# Patient Record
Sex: Female | Born: 1945 | Race: White | Hispanic: No | Marital: Married | Smoking: Former smoker
Health system: Southern US, Community
[De-identification: ages and names within clinical notes are randomized; demographics above are authoritative.]

## PROBLEM LIST (undated history)

## (undated) DIAGNOSIS — J45909 Unspecified asthma, uncomplicated: Secondary | ICD-10-CM

## (undated) DIAGNOSIS — E785 Hyperlipidemia, unspecified: Secondary | ICD-10-CM

## (undated) DIAGNOSIS — G4733 Obstructive sleep apnea (adult) (pediatric): Secondary | ICD-10-CM

## (undated) HISTORY — PX: BREAST LUMPECTOMY: SHX2

## (undated) HISTORY — DX: Hyperlipidemia, unspecified: E78.5

## (undated) HISTORY — DX: Unspecified asthma, uncomplicated: J45.909

## (undated) HISTORY — DX: Obstructive sleep apnea (adult) (pediatric): G47.33

## (undated) HISTORY — PX: RHINOPLASTY: SUR1284

---

## 2013-08-10 ENCOUNTER — Ambulatory Visit (INDEPENDENT_AMBULATORY_CARE_PROVIDER_SITE_OTHER): Payer: Medicare Other | Admitting: Internal Medicine

## 2013-08-10 ENCOUNTER — Ambulatory Visit (INDEPENDENT_AMBULATORY_CARE_PROVIDER_SITE_OTHER)
Admission: RE | Admit: 2013-08-10 | Discharge: 2013-08-10 | Disposition: A | Discharge status: Home / Self Care | Payer: Medicare Other | Source: Ambulatory Visit | Attending: Internal Medicine | Admitting: Internal Medicine

## 2013-08-10 ENCOUNTER — Encounter: Payer: Self-pay | Admitting: Internal Medicine

## 2013-08-10 VITALS — BP 112/74 | HR 66 | Temp 97.0°F | Ht 67.0 in | Wt 169.0 lb

## 2013-08-10 DIAGNOSIS — R9389 Abnormal findings on diagnostic imaging of other specified body structures: Secondary | ICD-10-CM

## 2013-08-10 DIAGNOSIS — J449 Chronic obstructive pulmonary disease, unspecified: Secondary | ICD-10-CM

## 2013-08-10 DIAGNOSIS — J45909 Unspecified asthma, uncomplicated: Secondary | ICD-10-CM | POA: Insufficient documentation

## 2013-08-10 DIAGNOSIS — R918 Other nonspecific abnormal finding of lung field: Secondary | ICD-10-CM

## 2013-08-10 MED ORDER — AMOXICILLIN-POT CLAVULANATE 875-125 MG PO TABS: 1.0000 | ORAL_TABLET | Freq: Two times a day (BID) | ORAL | Status: DC

## 2013-08-10 MED ORDER — MOMETASONE FURO-FORMOTEROL FUM 200-5 MCG/ACT IN AERO: INHALATION_SPRAY | RESPIRATORY_TRACT | Status: DC

## 2013-08-10 NOTE — Progress Notes (Signed)
Subjective:    Patient ID: Julia Christensen, female    DOB: 08/07/1946   MRN: [id number]  HPI  15 yowf former smoker with lots of bronchitis as child missed school but between spells did fine with exercise smoked  until 1987  With onset AB then got worse and freq pred rx then saw immunologist in [place] weaned off prednisone after several  Years but maint rx since then and self - referred 08/10/2013  to pulmonary with freq AB esp in summer times since moved in [place]  About 2004   08/10/2013 1st [facility] office visit/ Julia Christensen  Chief Complaint  Patient presents with  . Pulmonary Consult    Self referral- Pt states dxed with asthma in 1983. She c/o increased cough since July 2014. Cough is "constant" and is made worse by hot/humid weather. Cough is prod with large amounts of brown sputum. She also c/o increased DOE for the past 3 months- sometimes gets out of breath just walking a few steps.    Cough is worse after stir and 5pm. Has been seen at [facility] with dx ? Bronchiectasis 2 y prior to OV    No obvious patterns day to day or daytime variabilty or assoc chronic cough or cp or chest tightness, subjective wheeze overt sinus or hb symptoms. No unusual exp hx or h/o childhood pna/ asthma or knowledge of premature birth.  Sleeping ok without nocturnal  or early am exacerbation  of respiratory  c/o's or need for noct saba. Also denies any obvious fluctuation of symptoms with weather or environmental changes or other aggravating or alleviating factors except as outlined above   Current Medications, Allergies, Complete Past Medical History, Past Surgical History, Family History, and Social History were reviewed in [employer] record.  ROS  The following are not active complaints unless bolded sore throat, dysphagia, dental problems, itching, sneezing,  nasal congestion or excess/ purulent secretions, ear ache,   fever, chills, sweats, unintended wt loss, pleuritic or  exertional cp, hemoptysis,  orthopnea pnd or leg swelling, presyncope, palpitations, heartburn, abdominal pain, anorexia, nausea, vomiting, diarrhea  or change in bowel or urinary habits, change in stools or urine, dysuria,hematuria,  rash, arthralgias, visual complaints, headache, numbness weakness or ataxia or problems with walking or coordination,  change in mood/affect or memory.       Review of Systems  Constitutional: Negative for fever, chills and unexpected weight change.  HENT: Positive for congestion. Negative for ear pain, nosebleeds, sore throat, rhinorrhea, sneezing, trouble swallowing, dental problem, voice change, postnasal drip and sinus pressure.   Eyes: Negative for visual disturbance.  Respiratory: Positive for cough and shortness of breath. Negative for choking.   Cardiovascular: Negative for chest pain and leg swelling.  Gastrointestinal: Negative for vomiting, abdominal pain and diarrhea.  Genitourinary: Negative for difficulty urinating.       Acid Heartburn  Musculoskeletal: Positive for arthralgias.  Skin: Negative for rash.  Neurological: Positive for headaches. Negative for tremors and syncope.  Hematological: Does not bruise/bleed easily.       Objective:   Physical Exam   amb wf nad  Wt Readings from Last 3 Encounters:  08/10/13 169 lb (76.658 kg)     HEENT mild turbinate edema.  Oropharynx no thrush or excess pnd or cobblestoning.  No JVD or cervical adenopathy. Mild accessory muscle hypertrophy. Trachea midline, nl thryroid. Chest was hyperinflated by percussion with diminished breath sounds and moderate increased exp time without wheeze. Hoover sign positive at mid  inspiration. Regular rate and rhythm without murmur gallop or rub or increase P2 or edema.  Abd: no hsm, nl excursion. Ext warm without cyanosis or clubbing.         CXR  08/10/2013 :  Unusual right perihilar opacity which appears to reside within the right upper lobe. The possibility of a  perihilar mass is not excluded, and further evaluation with contrast enhanced chest CT is recommended at this time.      Assessment & Plan:

## 2013-08-10 NOTE — Assessment & Plan Note (Addendum)
Has been seen at [facility] with dx ? Bronchiectasis but last cxr report from [facility] (no ct on record) does not suggest a R Hilar process   Will repeat in 6 weeks and consider ct next in meantime rx with augmentin empically for ? Bronchiectasis flare

## 2013-08-10 NOTE — Assessment & Plan Note (Signed)
DDX of  difficult airways managment all start with A and  include Adherence, Ace Inhibitors, Acid Reflux, Active Sinus Disease, Alpha 1 Antitripsin deficiency, Anxiety masquerading as Airways dz,  ABPA,  allergy(esp in young), Aspiration (esp in elderly), Adverse effects of DPI,  Active smokers, plus two Bs  = Bronchiectasis and Beta blocker use..and one C= CHF  Adherence is always the initial "prime suspect" and is a multilayered concern that requires a "trust but verify" approach in every patient - starting with knowing how to use medications, especially inhalers, correctly, keeping up with refills and understanding the fundamental difference between maintenance and prns vs those medications only taken for a very short course and then stopped and not refilled. The proper method of use, as well as anticipated side effects, of a metered-dose inhaler are discussed and demonstrated to the patient. Improved effectiveness after extensive coaching during this visit to a level of approximately  75% so try dulera 200 2bid  ? Adverse effect of dpi > d/c advair  ? Bronchiectasis or active sinusitis > rx augmentin

## 2013-08-10 NOTE — Patient Instructions (Addendum)
Dulera 200 Take 2 puffs first thing in am and then another 2 puffs about 12 hours later.     Only use your albuterol (proventil)  as a rescue medication to be used if you can't catch your breath by resting or doing a relaxed purse lip breathing pattern.  - The less you use it, the better it will work when you need it. - Ok to use up to every 4 hours if you must but call for immediate appointment if use goes up over your usual need - Don't leave home without it !!  (think of it like your spare tire for your car)  - if this fails, then you would need the nebulizer up to every 4hours and call for appointment   Augmentin 875 twice daily x 10 days   Please remember to go to the  x-ray department downstairs for your tests - we will call you with the results when they are available.    Please schedule a follow up office visit in 4 weeks, sooner if needed  Late add cxr on return f/u R hilar fullness

## 2013-08-26 ENCOUNTER — Other Ambulatory Visit: Payer: Self-pay | Admitting: Internal Medicine

## 2013-08-26 MED ORDER — MOMETASONE FURO-FORMOTEROL FUM 200-5 MCG/ACT IN AERO: INHALATION_SPRAY | RESPIRATORY_TRACT | Status: DC

## 2013-09-01 ENCOUNTER — Telehealth: Payer: Self-pay | Admitting: Internal Medicine

## 2013-09-01 NOTE — Telephone Encounter (Signed)
I spoke with [facility]. Was advised they wanted to make sure pt was no longer going to be on advair since they received RX for dulera. I advised this is correct. Nothing further needed

## 2013-09-10 ENCOUNTER — Ambulatory Visit (INDEPENDENT_AMBULATORY_CARE_PROVIDER_SITE_OTHER)
Admission: RE | Admit: 2013-09-10 | Discharge: 2013-09-10 | Disposition: A | Discharge status: Home / Self Care | Payer: Medicare Other | Source: Ambulatory Visit | Attending: Internal Medicine | Admitting: Internal Medicine

## 2013-09-10 ENCOUNTER — Encounter: Payer: Self-pay | Admitting: Internal Medicine

## 2013-09-10 ENCOUNTER — Ambulatory Visit (INDEPENDENT_AMBULATORY_CARE_PROVIDER_SITE_OTHER): Payer: Medicare Other | Admitting: Internal Medicine

## 2013-09-10 ENCOUNTER — Other Ambulatory Visit (INDEPENDENT_AMBULATORY_CARE_PROVIDER_SITE_OTHER): Payer: Medicare Other

## 2013-09-10 VITALS — BP 122/80 | HR 67 | Temp 97.7°F | Ht 67.5 in | Wt 168.0 lb

## 2013-09-10 DIAGNOSIS — J449 Chronic obstructive pulmonary disease, unspecified: Secondary | ICD-10-CM

## 2013-09-10 DIAGNOSIS — J45909 Unspecified asthma, uncomplicated: Secondary | ICD-10-CM

## 2013-09-10 DIAGNOSIS — R918 Other nonspecific abnormal finding of lung field: Secondary | ICD-10-CM

## 2013-09-10 DIAGNOSIS — R9389 Abnormal findings on diagnostic imaging of other specified body structures: Secondary | ICD-10-CM

## 2013-09-10 LAB — CBC WITH DIFFERENTIAL/PLATELET
Basophils Absolute: 0 10*3/uL (ref 0.0–0.1)
Eosinophils Absolute: 0.9 10*3/uL — ABNORMAL HIGH (ref 0.0–0.7)
HCT: 44.5 % (ref 36.0–46.0)
Hemoglobin: 15.1 g/dL — ABNORMAL HIGH (ref 12.0–15.0)
Lymphs Abs: 2.6 10*3/uL (ref 0.7–4.0)
MCHC: 34 g/dL (ref 30.0–36.0)
Neutro Abs: 4.3 10*3/uL (ref 1.4–7.7)
RDW: 12.3 % (ref 11.5–14.6)

## 2013-09-10 LAB — PULMONARY FUNCTION TEST

## 2013-09-10 LAB — BASIC METABOLIC PANEL
CO2: 28 mEq/L (ref 19–32)
Calcium: 9.8 mg/dL (ref 8.4–10.5)
GFR: 72.49 mL/min (ref >60.00)
Glucose, Bld: 100 mg/dL — ABNORMAL HIGH (ref 70–99)
Sodium: 137 mEq/L (ref 135–145)

## 2013-09-10 MED ORDER — IOHEXOL 350 MG/ML SOLN
80.0000 mL | Freq: Once | INTRAVENOUS | Status: AC | PRN
  Administered 2013-09-10: 80 mL via INTRAVENOUS

## 2013-09-10 MED ORDER — FAMOTIDINE 20 MG PO TABS: ORAL_TABLET | ORAL | Status: DC

## 2013-09-10 MED ORDER — PANTOPRAZOLE SODIUM 40 MG PO TBEC: 40.0000 mg | DELAYED_RELEASE_TABLET | Freq: Every day | ORAL | Status: DC

## 2013-09-10 MED ORDER — PREDNISONE (PAK) 10 MG PO TABS: ORAL_TABLET | ORAL | Status: DC

## 2013-09-10 NOTE — Assessment & Plan Note (Addendum)
-   hfa 75% 08/10/2013  - pfts nl x fef 25-75 09/10/2013   Symptoms are markedly disproportionate to objective findings and not clear this is a lung problem but pt does appear to have difficult airway management issues. DDX of  difficult airways managment all start with A and  include Adherence, Ace Inhibitors, Acid Reflux, Active Sinus Disease, Alpha 1 Antitripsin deficiency, Anxiety masquerading as Airways dz,  ABPA,  allergy(esp in young), Aspiration (esp in elderly), Adverse effects of DPI,  Active smokers, plus two Bs  = Bronchiectasis and Beta blocker use..and one C= CHF  ? Acid (or non-acid) GERD > always difficult to exclude as up to 75% of pts in some series report no assoc GI/ Heartburn symptoms> rec max (24h)  acid suppression and diet restrictions/ reviewed and instructions given in writting  ? Allergy >  Eos 11%  ? Active sinus dz > ct sinus  ? Bronchiectasis > ct chest   For now try off dulera to see what difference if any this makes  See instructions for specific recommendations which were reviewed directly with the patient who was given a copy with highlighter outlining the key components.

## 2013-09-10 NOTE — Progress Notes (Signed)
PFT done today. 

## 2013-09-10 NOTE — Assessment & Plan Note (Signed)
cxr's reviewed with pt, no prior studies from [facility] in access anywhere > CT chest next step

## 2013-09-10 NOTE — Patient Instructions (Addendum)
Pantoprazole (protonix) 40 mg   Take 30-60 min before first meal of the day and Pepcid 20 mg one bedtime until return to office - this is the best way to tell whether stomach acid is contributing to your problem.    dulera 200 1-2 every 12 hours if you feel it helps  Prednisone 10 mg take  4 each am x 2 days,   2 each am x 2 days,  1 each am x 2 days and stop   Please see patient coordinator before you leave today  to schedule sinus and chest ct  Please remember to go to the lab department downstairs for your tests - we will call you with the results when they are available.  GERD (REFLUX)  is an extremely common cause of respiratory symptoms, many times with no significant heartburn at all.    It can be treated with medication, but also with lifestyle changes including avoidance of late meals, excessive alcohol, smoking cessation, and avoid fatty foods, chocolate, peppermint, colas, red wine, and acidic juices such as orange juice.  NO MINT OR MENTHOL PRODUCTS SO NO COUGH DROPS  USE SUGARLESS CANDY INSTEAD (jolley ranchers or Stover's)  NO OIL BASED VITAMINS - use powdered substitutes.    Please schedule a follow up office visit in 4 weeks, sooner if needed

## 2013-09-10 NOTE — Progress Notes (Signed)
Subjective:    Patient ID: Julia Christensen, female    DOB: Oct 02, 1946   MRN: [id number]    Brief patient profile:  47 yowf former smoker with lots of bronchitis as child missed school but between spells did fine with exercise smoked  until 1987  With onset AB then got worse and freq pred rx then saw immunologist in [place] weaned off prednisone after several  Years but maint rx since then and self - referred 08/10/2013  to pulmonary with freq AB esp in summer times since moved in [place]  About 2004    History of Present Illness  08/10/2013 1st [facility] office visit/ Wert  Chief Complaint  Patient presents with  . Pulmonary Consult    Self referral- Pt states dxed with asthma in 1983. She c/o increased cough since July 2014. Cough is "constant" and is made worse by hot/humid weather. Cough is prod with large amounts of brown sputum. She also c/o increased DOE for the past 3 months- sometimes gets out of breath just walking a few steps.  Cough is worse after stir and 5pm. Has been seen at [facility] with dx ? Bronchiectasis 2 y prior to OV rec Dulera 200 Take 2 puffs first thing in am and then another 2 puffs about 12 hours later.  Only use your albuterol (proventil)  as a rescue medication   Augmentin 875 twice daily x 10 days         09/10/2013 f/u ov/Wert re: asthma/ ? bronchiectasis Chief Complaint  Patient presents with  . Follow-up    PFT and CXR done today. Pt reports that her SOB and cough are unchanged since the last visit. She c/o increased hoarsness for the past wk.   walking flat ok, sob with hills but not very active  Cough was prod brown, now lighter  Not convinced better with dulera - off x 2 days and no change, prednisone works the best Not using saba much at all       No obvious patterns day to day or daytime variabilty or assoc  cp or chest tightness, subjective wheeze overt sinus or hb symptoms. No unusual exp hx or h/o childhood pna/ asthma or knowledge of  premature birth.  Sleeping ok without nocturnal  or early am exacerbation  of respiratory  c/o's or need for noct saba. Also denies any obvious fluctuation of symptoms with weather or environmental changes or other aggravating or alleviating factors except as outlined above   Current Medications, Allergies, Complete Past Medical History, Past Surgical History, Family History, and Social History were reviewed in [employer] record.  ROS  The following are not active complaints unless bolded sore throat, dysphagia, dental problems, itching, sneezing,  nasal congestion or excess/ purulent secretions, ear ache,   fever, chills, sweats, unintended wt loss, pleuritic or exertional cp, hemoptysis,  orthopnea pnd or leg swelling, presyncope, palpitations, heartburn, abdominal pain, anorexia, nausea, vomiting, diarrhea  or change in bowel or urinary habits, change in stools or urine, dysuria,hematuria,  rash, arthralgias, visual complaints, headache, numbness weakness or ataxia or problems with walking or coordination,  change in mood/affect or memory.           Objective:   Physical Exam   amb wf nad  Wt Readings from Last 3 Encounters:  09/10/13 168 lb (76.204 kg)  08/10/13 169 lb (76.658 kg)        HEENT: nl dentition, turbinates, and orophanx. Nl external ear canals without cough reflex  NECK :  without JVD/Nodes/TM/ nl carotid upstrokes bilaterally   LUNGS: insp and exp rhonchi with upper airway features   CV:  RRR  no s3 or murmur or increase in P2, no edema   ABD:  soft and nontender with nl excursion in the supine position. No bruits or organomegaly, bowel sounds nl  MS:  warm without deformities, calf tenderness, cyanosis or clubbing  SKIN: warm and dry without lesions    NEURO:  alert, approp, no deficits          CXR  09/10/2013 :  1. Persistent right perihilar density      Assessment & Plan:

## 2013-09-11 ENCOUNTER — Encounter: Payer: Self-pay | Admitting: Internal Medicine

## 2013-09-11 LAB — ALLERGY PROFILE REGION II-DC, DE, MD, ~~LOC~~, VA
Alternaria Alternata: <0.1 kU/L
Box Elder IgE: <0.1 kU/L
Cladosporium Herbarum: <0.1 kU/L
Cockroach: <0.1 kU/L
Dog Dander: <0.1 kU/L
IgE (Immunoglobulin E), Serum: 27.3 IU/mL (ref 0.0–180.0)
Johnson Grass: <0.1 kU/L
Lamb's Quarters: <0.1 kU/L
Pecan/Hickory Tree IgE: <0.1 kU/L

## 2013-09-11 NOTE — Progress Notes (Signed)
Quick Note:  Spoke with pt and notified of results per Dr. Wert. Pt verbalized understanding and denied any questions.  ______ 

## 2013-09-11 NOTE — Progress Notes (Signed)
Quick Note:  Advised pt's husband Gerlene Burdock) of labs per MW. He verbalized understanding and had no further questions ______

## 2013-09-22 ENCOUNTER — Telehealth: Payer: Self-pay | Admitting: Internal Medicine

## 2013-09-22 DIAGNOSIS — J45909 Unspecified asthma, uncomplicated: Secondary | ICD-10-CM

## 2013-09-22 MED ORDER — FLUTICASONE-SALMETEROL 250-50 MCG/DOSE IN AEPB: 1.0000 | INHALATION_SPRAY | Freq: Two times a day (BID) | RESPIRATORY_TRACT | Status: AC

## 2013-09-22 MED ORDER — CLOTRIMAZOLE 10 MG MT TROC: 10.0000 mg | Freq: Four times a day (QID) | OROMUCOSAL | Status: DC

## 2013-09-22 NOTE — Telephone Encounter (Signed)
Pt aware if recs and rx sent. Nothing further needed

## 2013-09-22 NOTE — Telephone Encounter (Signed)
Fine with me - give year's supply

## 2013-09-22 NOTE — Telephone Encounter (Signed)
I spoke with pt. She reports the dulera made her cough constantly. She stopped this medication Friday. She restarted advair 250-50 1 puff BID. Since doing the the cough has stopped and the CP has subsided. She will need an RX for the advair. Please advise MW thanks

## 2013-09-22 NOTE — Telephone Encounter (Signed)
Clotrimazole troch 10 mg qid prn #12

## 2013-09-22 NOTE — Telephone Encounter (Signed)
Pt is aware of recs. She forgot to mention she has thrush from [place]. She reports her mouth is coated in white stuff. She is requesting MMW. Please advise MW thanks  Allergies  Allergen Reactions  . Asa [Aspirin]     "bronchial tubes close"

## 2013-09-23 ENCOUNTER — Encounter: Payer: Self-pay | Admitting: Internal Medicine

## 2013-10-08 ENCOUNTER — Encounter: Payer: Self-pay | Admitting: Internal Medicine

## 2013-10-08 ENCOUNTER — Ambulatory Visit (INDEPENDENT_AMBULATORY_CARE_PROVIDER_SITE_OTHER): Payer: Medicare Other | Admitting: Internal Medicine

## 2013-10-08 VITALS — BP 132/74 | HR 64 | Temp 97.7°F | Ht 67.0 in | Wt 170.0 lb

## 2013-10-08 DIAGNOSIS — J45909 Unspecified asthma, uncomplicated: Secondary | ICD-10-CM

## 2013-10-08 DIAGNOSIS — J310 Chronic rhinitis: Secondary | ICD-10-CM

## 2013-10-08 DIAGNOSIS — R918 Other nonspecific abnormal finding of lung field: Secondary | ICD-10-CM

## 2013-10-08 DIAGNOSIS — R9389 Abnormal findings on diagnostic imaging of other specified body structures: Secondary | ICD-10-CM

## 2013-10-08 MED ORDER — FAMOTIDINE 20 MG PO TABS: ORAL_TABLET | ORAL | Status: DC

## 2013-10-08 MED ORDER — AMOXICILLIN-POT CLAVULANATE 875-125 MG PO TABS: 1.0000 | ORAL_TABLET | Freq: Two times a day (BID) | ORAL | Status: DC

## 2013-10-08 MED ORDER — PREDNISONE (PAK) 10 MG PO TABS: ORAL_TABLET | ORAL | Status: DC

## 2013-10-08 NOTE — Assessment & Plan Note (Signed)
I emphasized that nasal steroids have no immediate benefit in terms of improving symptoms.  To help them reached the target tissue, the patient should use Afrin two puffs every 12 hours applied one min before using the nasal steroids.  Afrin should be stopped after no more than 5 days.  If the symptoms worsen, Afrin can be restarted after 5 days off of therapy to prevent rebound congestion from overuse of Afrin.  I also emphasized that in no way are nasal steroids a concern in terms of "addiction".   If not better rx with augmentin and 6 d prednisone     Each maintenance medication was reviewed in detail including most importantly the difference between maintenance and as needed and under what circumstances the prns are to be used.  Please see instructions for details which were reviewed in writing and the patient given a copy.

## 2013-10-08 NOTE — Progress Notes (Signed)
Subjective:    Patient ID: Julia Christensen, female    DOB: 05/25/1946   MRN: [id number]    Brief patient profile:  50 yowf former smoker with lots of bronchitis as child missed school but between spells did fine with exercise smoked  until 1987  With onset AB then got worse and freq pred rx then saw immunologist in [place] weaned off prednisone after several  Years but maint rx since then and self - referred 08/10/2013  to pulmonary with freq AB esp in summer times since moved in [place]  About 2004    History of Present Illness  08/10/2013 1st [facility] office visit/ Dawaun Brancato  Chief Complaint  Patient presents with  . Pulmonary Consult    Self referral- Pt states dxed with asthma in 1983. She c/o increased cough since July 2014. Cough is "constant" and is made worse by hot/humid weather. Cough is prod with large amounts of brown sputum. She also c/o increased DOE for the past 3 months- sometimes gets out of breath just walking a few steps.  Cough is worse after stir and 5pm. Has been seen at [facility] with dx ? Bronchiectasis 2 y prior to OV rec Dulera 200 Take 2 puffs first thing in am and then another 2 puffs about 12 hours later.  Only use your albuterol (proventil)  as a rescue medication   Augmentin 875 twice daily x 10 days         09/10/2013 f/u ov/Almendra Loria re: asthma/ ? bronchiectasis Chief Complaint  Patient presents with  . Follow-up    PFT and CXR done today. Pt reports that her SOB and cough are unchanged since the last visit. She c/o increased hoarsness for the past wk.   walking flat ok, sob with hills but not very active  Cough was prod brown, now lighter  Not convinced better with dulera - off x 2 days and no change, prednisone works the best Not using saba much at all  Pantoprazole (protonix) 40 mg   Take 30-60 min before first meal of the day and Pepcid 20 mg one bedtime until return to office  .   dulera 200 1-2 every 12 hours > changed to Advair 09/22/13 pt  preference Prednisone 10 mg take  4 each am x 2 days,   2 each am x 2 days,  1 each am x 2 days and stop      10/08/2013 f/u ov/Raylyn Carton re: chronic asthma/ no need for rescue ot neb saba  Chief Complaint  Patient presents with  . Follow-up    Pt reports cough is doing some better since last visit. She c/o nasal and sinus congestion for the past wk.   not usually bothered by nasal stuffiness but does use flonase qam/ not using ppi or hs pepcid at all. Mucus turning slt yellow. Worse early am's     No obvious patterns day to day or daytime variabilty or assoc sob cp or chest tightness, subjective wheeze overt sinus or hb symptoms. No unusual exp hx or h/o childhood pna/ asthma or knowledge of premature birth.  Sleeping ok without nocturnal  or early am exacerbation  of respiratory  c/o's or need for noct saba. Also denies any obvious fluctuation of symptoms with weather or environmental changes or other aggravating or alleviating factors except as outlined above   Current Medications, Allergies, Complete Past Medical History, Past Surgical History, Family History, and Social History were reviewed in [employer] record.  ROS  The following are not active complaints unless bolded sore throat, dysphagia, dental problems, itching, sneezing,  nasal congestion or excess/ purulent secretions, ear ache,   fever, chills, sweats, unintended wt loss, pleuritic or exertional cp, hemoptysis,  orthopnea pnd or leg swelling, presyncope, palpitations, heartburn, abdominal pain, anorexia, nausea, vomiting, diarrhea  or change in bowel or urinary habits, change in stools or urine, dysuria,hematuria,  rash, arthralgias, visual complaints, headache, numbness weakness or ataxia or problems with walking or coordination,  change in mood/affect or memory.           Objective:   Physical Exam   amb wf nad  Wt Readings from Last 3 Encounters:  10/08/13 170 lb (77.111 kg)  09/10/13 168 lb  (76.204 kg)  08/10/13 169 lb (76.658 kg)            HEENT: nl dentition,  and orophanx. Nl external ear canals without cough reflex Bilateral non specific swelling / crusting both turn   NECK :  without JVD/Nodes/TM/ nl carotid upstrokes bilaterally   LUNGS: clear bilaterally    CV:  RRR  no s3 or murmur or increase in P2, no edema   ABD:  soft and nontender with nl excursion in the supine position. No bruits or organomegaly, bowel sounds nl  MS:  warm without deformities, calf tenderness, cyanosis or clubbing  SKIN: warm and dry without lesions               CXR  09/10/2013 : -  Persistent right perihilar density      Assessment & Plan:

## 2013-10-08 NOTE — Patient Instructions (Addendum)
Add back the pepcid at bedtime but ok leave pantaprazole off for now as long as you continue to observe the diet    Nasal steroids (flonase) have no immediate benefit in terms of improving symptoms.  To help them reached the target tissue,  yoy should use Afrin two puffs every 12 hours applied one min before using the nasal steroids.  Afrin should be stopped after no more than 5 days.  If the symptoms worsen, Afrin can be restarted after 5 days off of therapy to prevent rebound congestion from overuse of Afrin.  I also emphasized that in no way are nasal steroids a concern in terms of "addiction".   If not improving  Augmentin 875 mg take one pill twice daily  X 10 days - take at breakfast and supper with large glass of water.  It would help reduce the usual side effects (diarrhea and yeast infections) if you ate cultured yogurt at lunch.  Prednisone 10 mg take  4 each am x 2 days,   2 each am x 2 days,  1 each am x 2 days and stop   Please schedule a follow up visit in 3 months but call sooner if needed

## 2013-10-08 NOTE — Assessment & Plan Note (Signed)
1. No evidence of acute pulmonary embolism. 2. Diffuse central airway thickening with mucous impaction and lower lobes and peribronchial airspace disease in the right lower lobe. This is suspicious for peribronchial inflammation. No dominant mass identified. 3. Prominent lymphoid tissue in the hilar regions, likely reactive.  C/w chronic asthma > f/u cxr in 3 months

## 2013-10-08 NOTE — Assessment & Plan Note (Signed)
-   hfa 75% 08/10/2013  - pfts nl x fef 25-75 09/10/2013  - Allergy profile 09/10/2013 >  Eos  11 >  IgE 27.3 neg allergens - Sinus CT 09/10/2013 > chronic changes only  - requested advair to replace dulera 09/22/2013 due to cough  All goals of chronic asthma control met including optimal function and elimination of symptoms with minimal need for rescue therapy.  Contingencies discussed in full including contacting this office immediately if not controlling the symptoms using the rule of two's.

## 2014-03-04 ENCOUNTER — Ambulatory Visit: Payer: Self-pay | Admitting: Podiatry

## 2014-03-11 ENCOUNTER — Ambulatory Visit (INDEPENDENT_AMBULATORY_CARE_PROVIDER_SITE_OTHER): Payer: Medicare Other | Admitting: Podiatry

## 2014-03-11 ENCOUNTER — Ambulatory Visit (INDEPENDENT_AMBULATORY_CARE_PROVIDER_SITE_OTHER): Payer: Medicare Other

## 2014-03-11 ENCOUNTER — Encounter: Payer: Self-pay | Admitting: Podiatry

## 2014-03-11 DIAGNOSIS — M779 Enthesopathy, unspecified: Secondary | ICD-10-CM

## 2014-03-11 DIAGNOSIS — R52 Pain, unspecified: Secondary | ICD-10-CM

## 2014-03-11 DIAGNOSIS — M216X9 Other acquired deformities of unspecified foot: Secondary | ICD-10-CM

## 2014-03-11 DIAGNOSIS — L84 Corns and callosities: Secondary | ICD-10-CM

## 2014-03-11 NOTE — Progress Notes (Signed)
   Subjective:    Patient ID: Julia Christensen, female    DOB: 1946-11-01, 68 y.o.   MRN: [id number]  HPI Comments: "I have these calluses"  Patient c/o hard, thick, calluses plantar forefoot and 3rd toes bilateral and heel left for several years. She has trimmed these down with a razor before. They are very sensitive when walking.     Review of Systems  Musculoskeletal: Positive for gait problem.  All other systems reviewed and are negative.      Objective:   Physical Exam        Assessment & Plan:

## 2014-03-12 NOTE — Progress Notes (Signed)
Subjective:     Patient ID: Julia Christensen, female   DOB: 06/19/1946, 68 y.o.   MRN: [id number]  HPI patient presents stating I have also calluses on the bottom of both my feet that make walking difficult and make any form of exercise almost impossible. Been going on for several years   Review of Systems  All other systems reviewed and are negative.      Objective:   Physical Exam  Nursing note and vitals reviewed. Constitutional: She is oriented to person, place, and time.  Cardiovascular: Intact distal pulses.   Musculoskeletal: Normal range of motion.  Neurological: She is oriented to person, place, and time.  Skin: Skin is dry.   neurovascular status is intact with range of motion within normal limits subtalar midtarsal joint and muscle strength adequate. 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 Deep debridement of lesions was accomplished today and orthotics scanned to try to take pressure off the feet at this time

## 2014-03-26 ENCOUNTER — Ambulatory Visit: Payer: Medicare Other | Admitting: *Deleted

## 2014-03-26 DIAGNOSIS — L84 Corns and callosities: Secondary | ICD-10-CM

## 2014-03-26 NOTE — Patient Instructions (Signed)

## 2014-03-26 NOTE — Progress Notes (Signed)
   Subjective:    Patient ID: Julia Christensen, female    DOB: 1946-05-01, 68 y.o.   MRN: [id number]  HPI PICK UP ORTHOTICS AND GIVEN INSTRUCTION.   Review of Systems     Objective:   Physical Exam        Assessment & Plan:

## 2014-04-20 IMAGING — CT CT PARANASAL SINUSES LIMITED
1 of 2 series · 15 of 19 positions shown, 19 images · non-contrast
Comparison: None.

CLINICAL DATA: History of chronic sinus drainage. Retro-orbital
headaches.

EXAM:
CT PARANASAL SINUS LIMITED WITHOUT CONTRAST
TECHNIQUE: Non-contiguous multidetector CT images of the paranasal sinuses were
obtained in a single plane without contrast. BB marker was placed on
the right.

[Series 4: ltd sinus 3.0 h30s · axial · 0.32mm/px · z∈[-90,+5]mm · 15 of 18 slices shown, 19 images]
[im 2/18  brain]
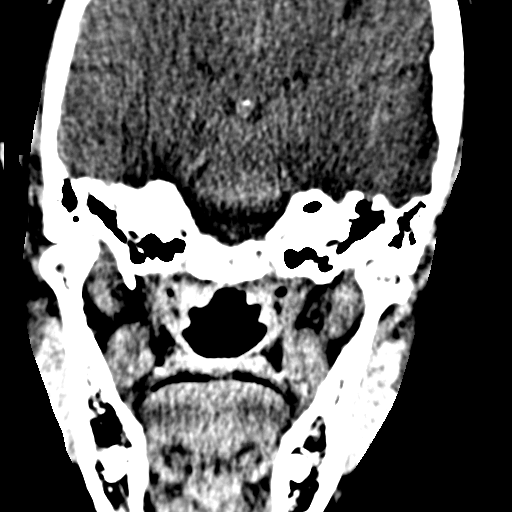
[im 2/18  bone]
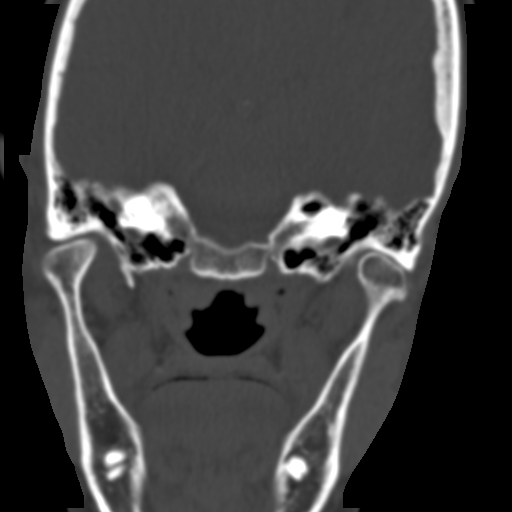
[im 3/18  bone]
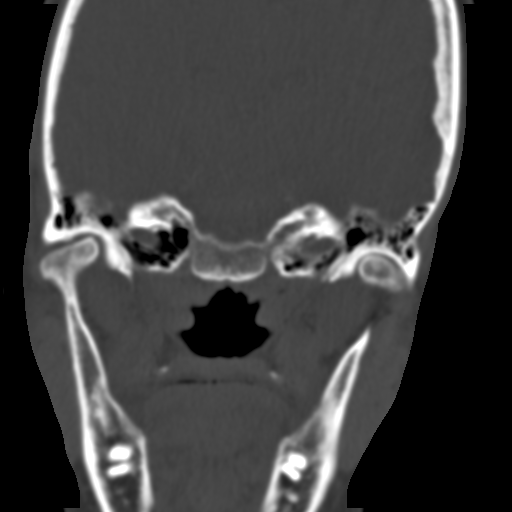
[im 4/18  bone]
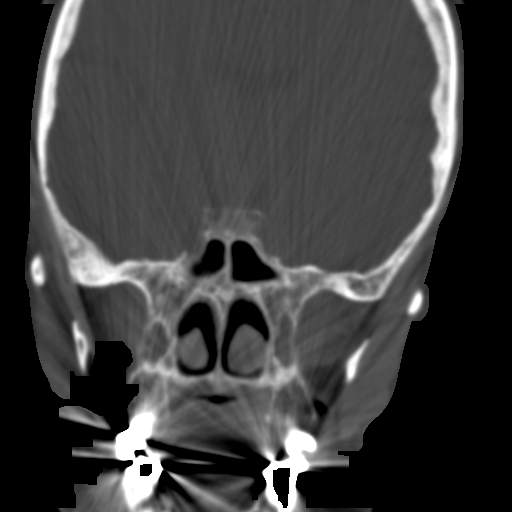
[im 5/18  bone]
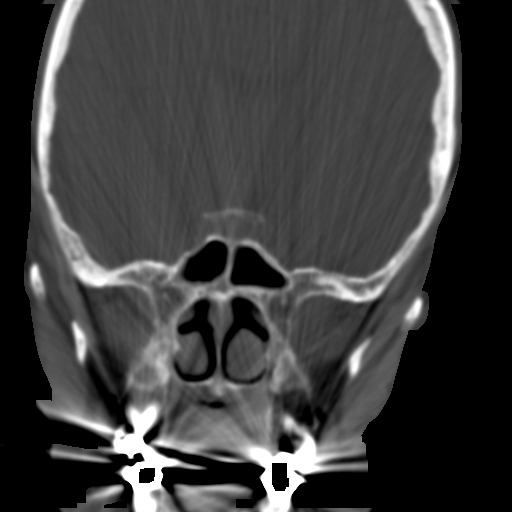
[im 6/18  brain]
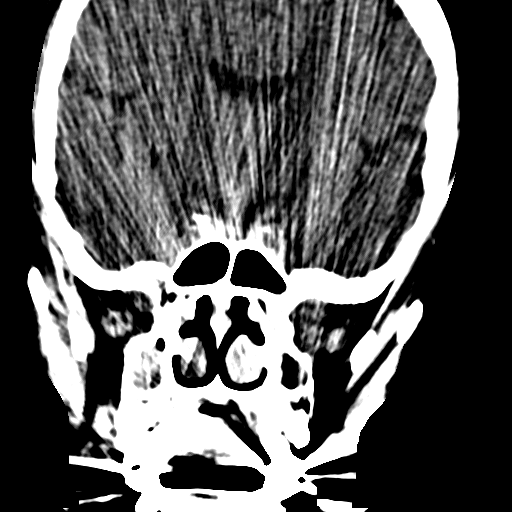
[im 6/18  bone]
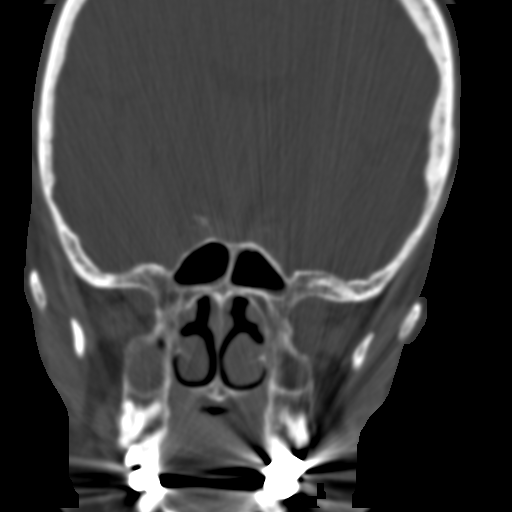
[im 7/18  bone]
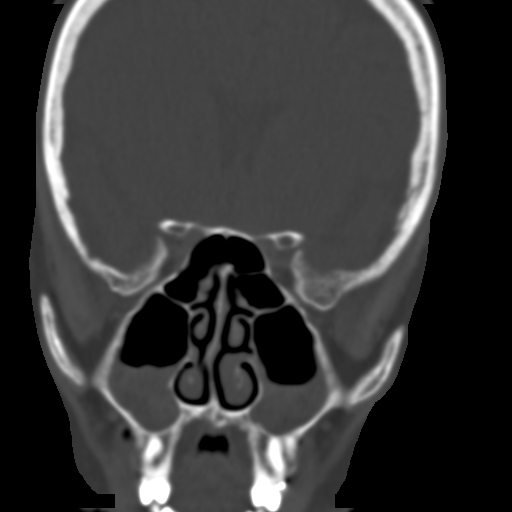
[im 8/18  bone]
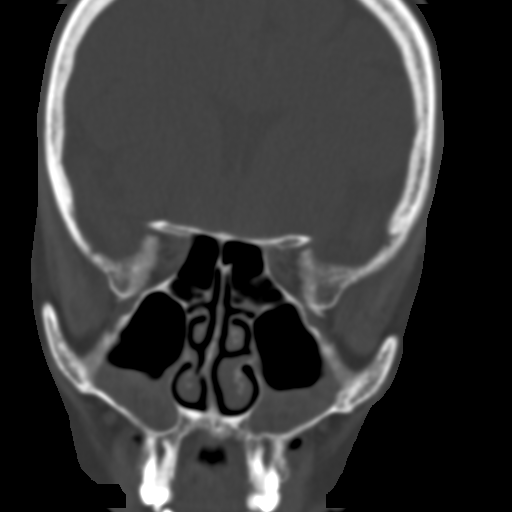
[im 10/18  bone]
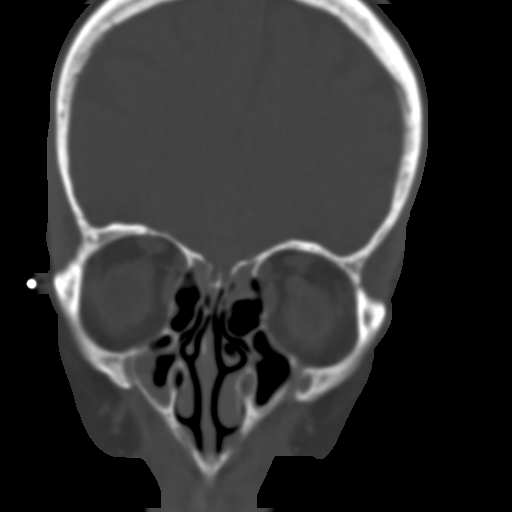
[im 11/18  brain]
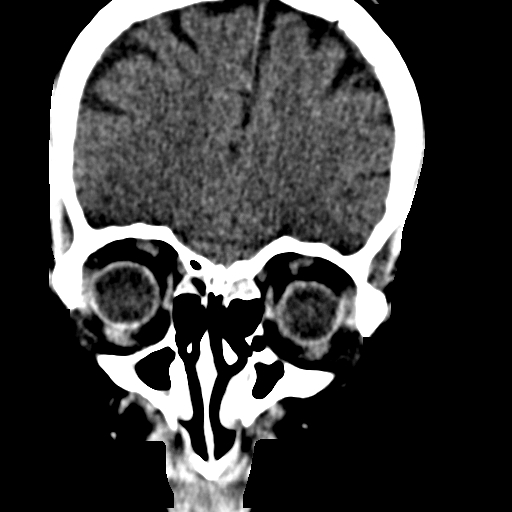
[im 11/18  bone]
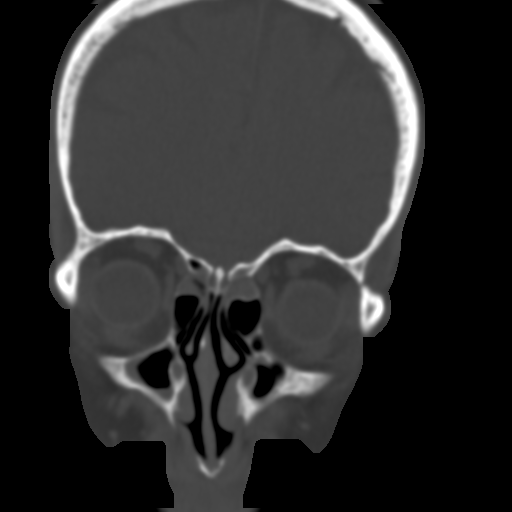
[im 12/18  bone]
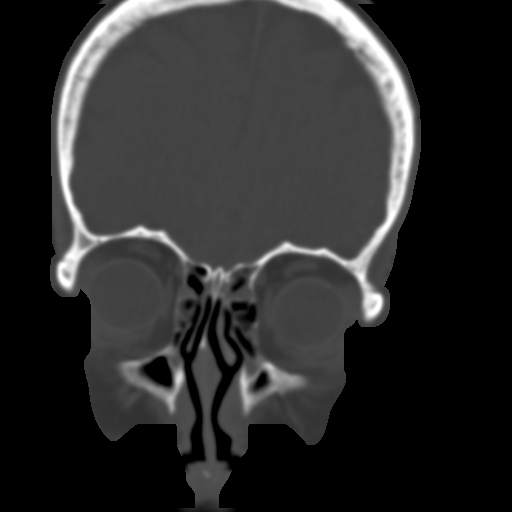
[im 13/18  bone]
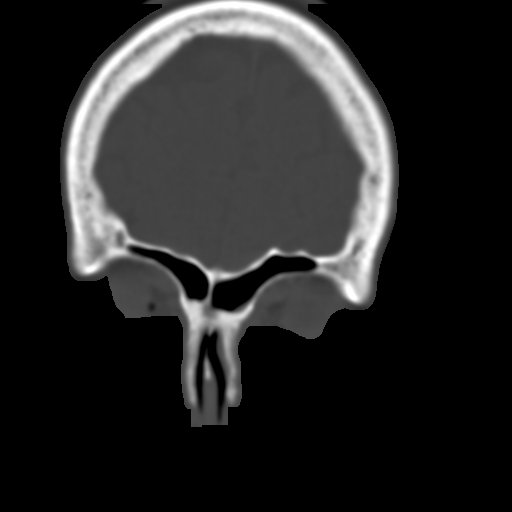
[im 14/18  bone]
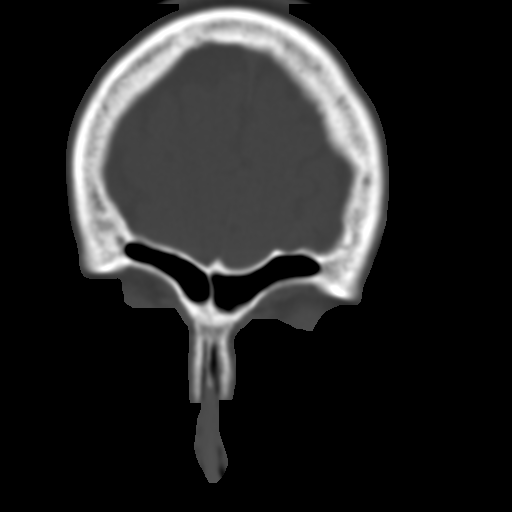
[im 15/18  brain]
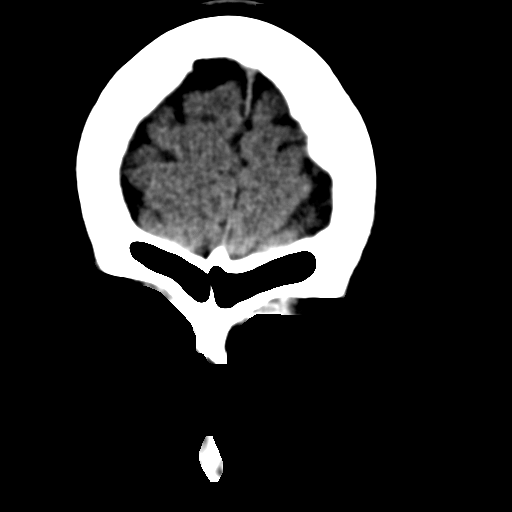
[im 15/18  bone]
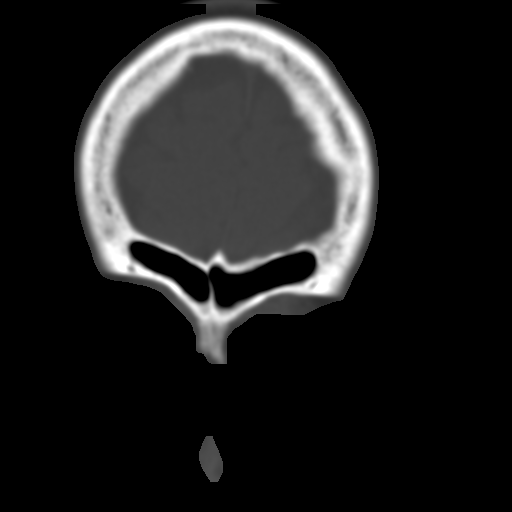
[im 16/18  bone]
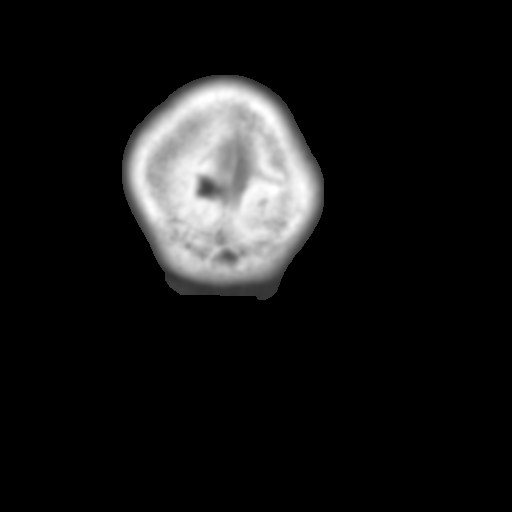
[im 17/18  bone]
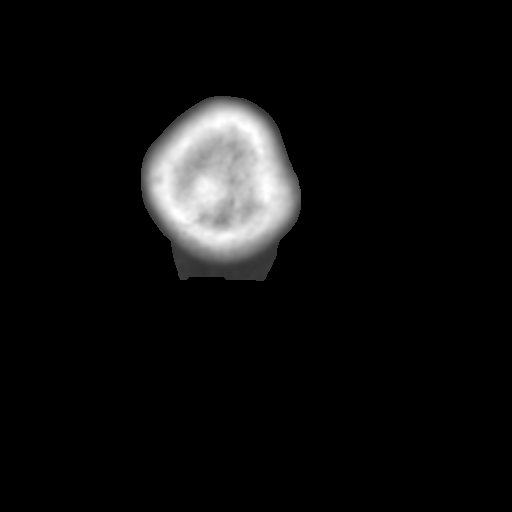

[15 of 19 positions shown; findings below may reference images not displayed]

FINDINGS: There is slight nasal septal deviation to the right. There is
minimal spurring on the right. No nasal septal perforation or
destruction is evident. There is concha bullosa of the middle
terminate on the left.

There is considerable mucosal thickening involving both maxillary
sinuses. There is mucosal thickening in the area of the maxillary at
ostium bilaterally. There is mucosal thickening involving bilateral
ethmoid air cells. There is very minimal mucosal thickening
involving the frontal sinuses. There is no significant sphenoid
sinus mucosal thickening is seen. No air-fluid level, sinus
expansion, or bony destruction is evident.

The portion of the mastoids seen bilaterally show well-aerated
mastoid air cells. Portion of the skull included on examination
appears normal. Portion of the brain included on examination shows
some cortical atrophy.
IMPRESSION: There is considerable mucosal thickening involving both maxillary
sinuses. There is mucosal thickening in the area of the maxillary at
ostium bilaterally. There is mucosal thickening involving bilateral
ethmoid air cells. There is very minimal mucosal thickening
involving the frontal sinuses. There is no significant sphenoid
sinus mucosal thickening is seen. No air-fluid level, sinus
expansion, or bony destruction is evident.

Slight nasal septal deviation to the right.

## 2014-04-22 ENCOUNTER — Encounter: Payer: Self-pay | Admitting: Podiatry

## 2014-04-22 ENCOUNTER — Ambulatory Visit (INDEPENDENT_AMBULATORY_CARE_PROVIDER_SITE_OTHER): Payer: Medicare Other | Admitting: Podiatry

## 2014-04-22 VITALS — BP 123/74 | HR 64 | Resp 16

## 2014-04-22 DIAGNOSIS — L84 Corns and callosities: Secondary | ICD-10-CM

## 2014-04-22 NOTE — Progress Notes (Signed)
Subjective:     Patient ID: Julia Christensen, female   DOB: 1946-04-11, 68 y.o.   MRN: [id number]  HPI patient states the orthotics are really helping me that the lesions have returned right over left foot and are painful   Review of Systems     Objective:   Physical Exam Plantar keratotic lesion right over left foot    Assessment:     Chronic lesion formation secondary to structure and skin type    Plan:     Debris painful callus on both feet with no bleeding noted reappoint as needed

## 2014-07-01 ENCOUNTER — Encounter: Payer: Self-pay | Admitting: Podiatry

## 2014-07-01 ENCOUNTER — Ambulatory Visit: Payer: Medicare Other | Admitting: Podiatry

## 2014-07-01 VITALS — BP 152/84 | HR 69 | Resp 15 | Ht 67.0 in | Wt 170.0 lb

## 2014-07-01 DIAGNOSIS — L84 Corns and callosities: Secondary | ICD-10-CM | POA: Diagnosis not present

## 2014-07-01 NOTE — Progress Notes (Signed)
Subjective:     Patient ID: Julia Christensen, female   DOB: [birth year], 68 y.o.   MRN: [id number]  HPI patient presents with numerous lesions on the plantar of both feet and the third toes   Review of Systems     Objective:   Physical Exam Neurovascular status intact with approximate 7 keratotic lesions that are painful when pressed    Assessment:     Chronic lesion formation both feet    Plan:     Debridement lesions on both feet with no iatrogenic bleeding noted

## 2014-10-21 ENCOUNTER — Ambulatory Visit (INDEPENDENT_AMBULATORY_CARE_PROVIDER_SITE_OTHER): Payer: Medicare Other | Admitting: Podiatry

## 2014-10-21 ENCOUNTER — Encounter: Payer: Self-pay | Admitting: Podiatry

## 2014-10-21 DIAGNOSIS — M2041 Other hammer toe(s) (acquired), right foot: Secondary | ICD-10-CM | POA: Diagnosis not present

## 2014-10-21 DIAGNOSIS — Q667 Congenital pes cavus: Secondary | ICD-10-CM

## 2014-10-21 DIAGNOSIS — L84 Corns and callosities: Secondary | ICD-10-CM

## 2014-10-21 DIAGNOSIS — M216X9 Other acquired deformities of unspecified foot: Secondary | ICD-10-CM

## 2014-10-24 NOTE — Progress Notes (Signed)
Subjective:     Patient ID: Julia Christensen, female   DOB: 11/28/1945, 68 y.o.   MRN: [id number]  HPI patient presents stating the calluses started to bother me again and also I'm having trouble with my toe and may need surgery on   Review of Systems     Objective:   Physical Exam Neurovascular status unchanged with patient being well oriented 3 and noted to have good digital perfusion with muscle strength adequate range of motion within normal limits. Patient has keratotic lesion plantar aspect first metatarsal right and structural deformity of the second digit with elevation of the toe noted with redness and pain    Assessment:     Combination of chronic callus formation secondary to diminished fat pad and foot structure along with lesion formation secondary to digital deformity    Plan:     Reviewed both conditions and discussed ultimately digital correction. Today were to go ahead and debride lesion and pad and see how she responds and decide whether or not surgery will be appropriate which I discussed with her

## 2014-11-15 ENCOUNTER — Ambulatory Visit: Payer: Medicare Other | Admitting: Podiatry

## 2014-11-29 ENCOUNTER — Ambulatory Visit (INDEPENDENT_AMBULATORY_CARE_PROVIDER_SITE_OTHER): Payer: Medicare Other | Admitting: Podiatry

## 2014-11-29 ENCOUNTER — Encounter: Payer: Self-pay | Admitting: Podiatry

## 2014-11-29 DIAGNOSIS — L84 Corns and callosities: Secondary | ICD-10-CM | POA: Diagnosis not present

## 2014-12-01 NOTE — Progress Notes (Signed)
Subjective:     Patient ID: Julia SongLorraine Stelly, female   DOB: 1946/04/24, 69 y.o.   MRN: [id number]  HPI patient presents with calluses on both feet and also to pickup modified orthotics   Review of Systems     Objective:   Physical Exam Neurovascular status intact with plantar keratotic lesion bilateral that become painful    Assessment:     Corn callus formation secondary to bone structure    Plan:     Debrided lesions bilateral and dispensed orthotics with instructions and reappoint when symptomatic

## 2015-01-24 ENCOUNTER — Encounter: Payer: Self-pay | Admitting: Podiatry

## 2015-01-24 ENCOUNTER — Ambulatory Visit (INDEPENDENT_AMBULATORY_CARE_PROVIDER_SITE_OTHER): Payer: Medicare Other | Admitting: Podiatry

## 2015-01-24 DIAGNOSIS — L84 Corns and callosities: Secondary | ICD-10-CM

## 2015-01-24 NOTE — Progress Notes (Signed)
Subjective:     Patient ID: Julia Christensen, female   DOB: 1946/01/24, 69 y.o.   MRN: [id number]  HPI patient presents for lesion formation bilateral   Review of Systems     Objective:   Physical Exam Neurovascular status intact with keratotic lesions plantar of both feet    Assessment:     Thick lesion formation    Plan:     Debridement of lesions on both feet with no iatrogenic bleeding noted

## 2015-04-25 ENCOUNTER — Encounter: Payer: Self-pay | Admitting: Podiatry

## 2015-04-25 ENCOUNTER — Ambulatory Visit (INDEPENDENT_AMBULATORY_CARE_PROVIDER_SITE_OTHER): Payer: Medicare Other | Admitting: Podiatry

## 2015-04-25 VITALS — BP 146/78 | HR 65 | Resp 15

## 2015-04-25 DIAGNOSIS — L84 Corns and callosities: Secondary | ICD-10-CM

## 2015-04-26 NOTE — Progress Notes (Signed)
Subjective:     Patient ID: Julia Christensen, female   DOB: 1946-04-22, 69 y.o.   MRN: [id number]  HPI patient presents with lesions on the bottom of both feet the become painful and makes shoe gear difficult   Review of Systems     Objective:   Physical Exam Neurovascular status intact with keratotic lesion submetatarsal of both feet    Assessment:     Lesion secondary to metatarsal depression    Plan:     Debris lesions both feet with no iatrogenic bleeding noted

## 2015-07-04 ENCOUNTER — Encounter: Payer: Self-pay | Admitting: Podiatry

## 2015-07-04 ENCOUNTER — Ambulatory Visit (INDEPENDENT_AMBULATORY_CARE_PROVIDER_SITE_OTHER): Payer: Medicare Other | Admitting: Podiatry

## 2015-07-04 DIAGNOSIS — L84 Corns and callosities: Secondary | ICD-10-CM | POA: Diagnosis not present

## 2015-07-05 NOTE — Progress Notes (Signed)
Subjective:     Patient ID: Julia Christensen, female   DOB: July 18, 1946, 69 y.o.   MRN: [id number]  HPI patient presents with plantar calluses of both feet that are painful   Review of Systems     Objective:   Physical Exam Neurovascular status intact with keratotic lesion sub-second fourth metatarsal bilateral that are painful    Assessment:     Lesion secondary to plantar pressure    Plan:     Debride painful lesions bilateral with no iatrogenic bleeding noted

## 2015-09-02 ENCOUNTER — Ambulatory Visit (INDEPENDENT_AMBULATORY_CARE_PROVIDER_SITE_OTHER): Payer: Medicare Other | Admitting: Podiatry

## 2015-09-02 ENCOUNTER — Encounter: Payer: Self-pay | Admitting: Podiatry

## 2015-09-02 VITALS — BP 115/69 | HR 65 | Resp 16

## 2015-09-02 DIAGNOSIS — L84 Corns and callosities: Secondary | ICD-10-CM | POA: Diagnosis not present

## 2015-09-02 DIAGNOSIS — M2041 Other hammer toe(s) (acquired), right foot: Secondary | ICD-10-CM

## 2015-09-04 NOTE — Progress Notes (Signed)
Subjective:     Patient ID: Julia Christensen, female   DOB: 03-12-1946, 69 y.o.   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

## 2015-12-13 ENCOUNTER — Ambulatory Visit: Payer: Medicare Other | Admitting: Podiatry

## 2015-12-16 ENCOUNTER — Encounter: Payer: Self-pay | Admitting: Podiatry

## 2015-12-16 ENCOUNTER — Ambulatory Visit (INDEPENDENT_AMBULATORY_CARE_PROVIDER_SITE_OTHER): Payer: Medicare Other | Admitting: Podiatry

## 2015-12-16 DIAGNOSIS — L84 Corns and callosities: Secondary | ICD-10-CM

## 2015-12-16 DIAGNOSIS — M2041 Other hammer toe(s) (acquired), right foot: Secondary | ICD-10-CM

## 2015-12-16 NOTE — Progress Notes (Signed)
Subjective:     Patient ID: Julia Christensen, female   DOB: [birth year], 70 y.o.   MRN: [id number]  HPI this patient presents the office with chief complaint of painful calluses on the bottom of her third toe both feet she also has severe diffuse callus noted on the bottom of the balls of both feet she has a especially painful callus on the bottom of the right forefoot. She has previously been seen and evaluated for possible surgical intervention she presents the office today for an preventive foot care services.   Review of Systems     Objective:   Physical Exam GENERAL APPEARANCE: Alert, conversant. Appropriately groomed. No acute distress.  VASCULAR: Pedal pulses palpable at  [facility] and PT bilateral.  Capillary refill time is immediate to all digits,  Normal temperature gradient.  Digital hair growth is present bilateral  NEUROLOGIC: sensation is normal to 5.07 monofilament at 5/5 sites bilateral.  Light touch is intact bilateral, Muscle strength normal.  MUSCULOSKELETAL: acceptable muscle strength, tone and stability bilateral.  Intrinsic muscluature intact bilateral.  Rectus appearance of foot and digits noted bilateral. 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    DERMATOLOGIC: skin color, texture, and turgor are within normal limits.  No preulcerative lesions or ulcers  are seen, no interdigital maceration noted.  No open lesions present.  Digital nails are asymptomatic. No drainage noted. Diffuse callus forefeet B/L.  Porokeratosis right forefoot.     Assessment:     Porokeratosis/  Callus B/L     Plan:      Debridement of callus B/L.  Discussed surgery with patient.  RTC 10 weeks.   Helane Gunther DPM

## 2016-02-24 ENCOUNTER — Encounter: Payer: Self-pay | Admitting: Podiatry

## 2016-02-24 ENCOUNTER — Ambulatory Visit (INDEPENDENT_AMBULATORY_CARE_PROVIDER_SITE_OTHER): Payer: Medicare Other | Admitting: Podiatry

## 2016-02-24 DIAGNOSIS — M216X9 Other acquired deformities of unspecified foot: Secondary | ICD-10-CM

## 2016-02-24 DIAGNOSIS — M2041 Other hammer toe(s) (acquired), right foot: Secondary | ICD-10-CM

## 2016-02-24 DIAGNOSIS — L84 Corns and callosities: Secondary | ICD-10-CM | POA: Diagnosis not present

## 2016-02-24 NOTE — Progress Notes (Signed)
Subjective:     Patient ID: Julia Christensen, female   DOB: 03/05/1946, 70 y.o.   MRN: [id number]  HPI this patient presents the office with chief complaint of painful calluses on the bottom of her third toe both feet she also has severe diffuse callus noted on the bottom of the balls of both feet she has a especially painful callus on the bottom of the right forefoot. She has previously been seen and evaluated for possible surgical intervention she presents the office today for an preventive foot care services.   Review of Systems     Objective:   Physical Exam GENERAL APPEARANCE: Alert, conversant. Appropriately groomed. No acute distress.  VASCULAR: Pedal pulses palpable at  DP and PT bilateral.  Capillary refill time is immediate to all digits,  Normal temperature gradient.  Digital hair growth is present bilateral  NEUROLOGIC: sensation is normal to 5.07 monofilament at 5/5 sites bilateral.  Light touch is intact bilateral, Muscle strength normal.  MUSCULOSKELETAL: acceptable muscle strength, tone and stability bilateral.  Intrinsic muscluature intact bilateral.  Rectus appearance of foot and digits noted bilateral. 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    DERMATOLOGIC: skin color, texture, and turgor are within normal limits.  No preulcerative lesions or ulcers  are seen, no interdigital maceration noted.  No open lesions present.  Digital nails are asymptomatic. No drainage noted. Diffuse callus forefeet B/L.  Porokeratosis right forefoot.     Assessment:     Porokeratosis/  Callus B/L     Plan:      Debridement of callus B/L.  Discussed surgery with patient.  RTC 10 weeks. Dispense padding.   Trust Crago DPM   

## 2016-04-27 ENCOUNTER — Ambulatory Visit (INDEPENDENT_AMBULATORY_CARE_PROVIDER_SITE_OTHER): Payer: Medicare Other | Admitting: Podiatry

## 2016-04-27 ENCOUNTER — Encounter: Payer: Self-pay | Admitting: Podiatry

## 2016-04-27 DIAGNOSIS — L84 Corns and callosities: Secondary | ICD-10-CM | POA: Diagnosis not present

## 2016-04-27 NOTE — Progress Notes (Signed)
Subjective:     Patient ID: Julia Christensen, female   DOB: 1946/04/10, 70 y.o.   MRN: [id number]  HPI this patient presents the office with chief complaint of painful calluses on the bottom of her third toe both feet she also has severe diffuse callus noted on the bottom of the balls of both feet she has a especially painful callus on the bottom of the right forefoot. She has previously been seen and evaluated for possible surgical intervention she presents the office today for an preventive foot care services.   Review of Systems     Objective:   Physical Exam GENERAL APPEARANCE: Alert, conversant. Appropriately groomed. No acute distress.  [facility].  Capillary refill time is immediate to all digits,  Normal temperature gradient.  Digital hair growth is present bilateral  NEUROLOGIC: sensation is normal to 5.07 monofilament at 5/5 sites bilateral.  Light touch is intact bilateral, Muscle strength normal.  MUSCULOSKELETAL: acceptable muscle strength, tone and stability bilateral.  Intrinsic muscluature intact bilateral.  Rectus appearance of foot and digits noted bilateral. 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    DERMATOLOGIC: skin color, texture, and turgor are within normal limits.  No preulcerative lesions or ulcers  are seen, no interdigital maceration noted.  No open lesions present.  Digital nails are asymptomatic. No drainage noted. Diffuse callus forefeet B/L.  Porokeratosis right forefoot.     Assessment:     Porokeratosis/  Callus B/L     Plan:      Debridement of callus B/L.  Discussed surgery with patient.  RTC 10 weeks. Dispense padding.   Helane GuntherGregory Maurice Fotheringham DPM

## 2016-07-13 ENCOUNTER — Encounter: Payer: Self-pay | Admitting: Podiatry

## 2016-07-13 ENCOUNTER — Ambulatory Visit (INDEPENDENT_AMBULATORY_CARE_PROVIDER_SITE_OTHER): Payer: Medicare Other | Admitting: Podiatry

## 2016-07-13 DIAGNOSIS — L84 Corns and callosities: Secondary | ICD-10-CM

## 2016-07-13 NOTE — Progress Notes (Signed)
Subjective:     Patient ID: Julia Christensen, female   DOB: 12/28/1945, 70 y.o.   MRN: [id number]  HPI this patient presents the office with chief complaint of painful calluses on the bottom of her third toe both feet she also has severe diffuse callus noted on the bottom of the balls of both feet she has a especially painful callus on the bottom of the right forefoot.   Localized callus areas noted right foot. she presents the office today for an preventive foot care services.   Review of Systems     Objective:   Physical Exam GENERAL APPEARANCE: Alert, conversant. Appropriately groomed. No acute distress.  VASCULAR: Pedal pulses palpable at  DP and PT bilateral.  Capillary refill time is immediate to all digits,  Normal temperature gradient.  Digital hair growth is present bilateral  NEUROLOGIC: sensation is normal to 5.07 monofilament at 5/5 sites bilateral.  Light touch is intact bilateral, Muscle strength normal.  MUSCULOSKELETAL: acceptable muscle strength, tone and stability bilateral.  Intrinsic muscluature intact bilateral.  Rectus appearance of foot and digits noted bilateral. 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    DERMATOLOGIC: skin color, texture, and turgor are within normal limits.  No preulcerative lesions or ulcers  are seen, no interdigital maceration noted.  No open lesions present.  Digital nails are asymptomatic. No drainage noted. Diffuse callus forefeet B/L.  Porokeratosis right forefoot.     Assessment:     Porokeratosis/  Callus B/L     Plan:      Debridement of callus B/L.  Discussed surgery with patient.  RTC 10 weeks. Dispense padding.   Tykel Badie DPM   

## 2016-09-14 ENCOUNTER — Ambulatory Visit: Payer: Medicare Other | Admitting: Podiatry

## 2016-10-16 ENCOUNTER — Ambulatory Visit (INDEPENDENT_AMBULATORY_CARE_PROVIDER_SITE_OTHER): Payer: Medicare Other | Admitting: Podiatry

## 2016-10-16 ENCOUNTER — Encounter: Payer: Self-pay | Admitting: Podiatry

## 2016-10-16 VITALS — Ht 67.0 in | Wt 170.0 lb

## 2016-10-16 DIAGNOSIS — L84 Corns and callosities: Secondary | ICD-10-CM

## 2016-10-16 NOTE — Progress Notes (Signed)
Subjective:     Patient ID: Julia Christensen, female   DOB: 01/20/1946, 70 y.o.   MRN: [id number]  HPI this patient presents the office with chief complaint of painful calluses on the bottom of her third toe both feet she also has severe diffuse callus noted on the bottom of the balls of both feet she has a especially painful callus on the bottom of the right forefoot.   Localized callus areas noted right foot. she presents the office today for an preventive foot care services.   Review of Systems     Objective:   Physical Exam GENERAL APPEARANCE: Alert, conversant. Appropriately groomed. No acute distress.  VASCULAR: [facility]al [facility]  [facility].  Capillary refill time is immediate to all digits,  Normal temperature gradient.  Digital hair growth is present bilateral  NEUROLOGIC: sensation is normal to 5.07 monofilament at 5/5 sites bilateral.  Light touch is intact bilateral, Muscle strength normal.  MUSCULOSKELETAL: acceptable muscle strength, tone and stability bilateral.  Intrinsic muscluature intact bilateral.  Rectus appearance of foot and digits noted bilateral. 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    DERMATOLOGIC: skin color, texture, and turgor are within normal limits.  No preulcerative lesions or ulcers  are seen, no interdigital maceration noted.  No open lesions present.  Digital nails are asymptomatic. No drainage noted. Diffuse callus forefeet B/L.  Porokeratosis right forefoot.     Assessment:     Porokeratosis/  Callus B/L     Plan:      Debridement of callus B/L.  Discussed surgery with patient.  RTC 10 weeks. Dispense padding.   Helane GuntherGregory Charm Stenner DPM

## 2016-12-21 ENCOUNTER — Ambulatory Visit: Payer: Medicare Other | Admitting: Podiatry

## 2016-12-28 ENCOUNTER — Ambulatory Visit: Payer: Medicare Other | Admitting: Podiatry

## 2017-01-04 ENCOUNTER — Ambulatory Visit: Payer: Medicare Other | Admitting: Podiatry

## 2017-01-08 ENCOUNTER — Ambulatory Visit (INDEPENDENT_AMBULATORY_CARE_PROVIDER_SITE_OTHER): Payer: Medicare Other | Admitting: Podiatry

## 2017-01-08 ENCOUNTER — Encounter: Payer: Self-pay | Admitting: Podiatry

## 2017-01-08 DIAGNOSIS — L84 Corns and callosities: Secondary | ICD-10-CM

## 2017-01-08 NOTE — Progress Notes (Signed)
Subjective:     Patient ID: Julia Christensen, female   DOB: [birth year], 71 y.o.   MRN: [id number]  HPI this patient presents the office with chief complaint of painful calluses on the bottom of her third toe both feet she also has severe diffuse callus noted on the bottom of the balls of both feet she has a especially painful callus on the bottom of the right forefoot.   Localized callus areas noted right foot. she presents the office today for an preventive foot care services.   Review of Systems     Objective:   Physical Exam GENERAL APPEARANCE: Alert, conversant. Appropriately groomed. No acute distress.  VASCULAR: [facility]  [facility].  Capillary refill time is immediate to all digits,  Normal temperature gradient.  Digital hair growth is present bilateral  NEUROLOGIC: sensation is normal to 5.07 monofilament at 5/5 sites bilateral.  Light touch is intact bilateral, Muscle strength normal.  MUSCULOSKELETAL: acceptable muscle strength, tone and stability bilateral.  Intrinsic muscluature intact bilateral.  Rectus appearance of foot and digits noted bilateral. 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    DERMATOLOGIC: skin color, texture, and turgor are within normal limits.  No preulcerative lesions or ulcers  are seen, no interdigital maceration noted.  No open lesions present.  Digital nails are asymptomatic. No drainage noted. Diffuse callus forefeet B/L.  Porokeratosis right forefoot.     Assessment:     Porokeratosis/  Callus B/L     Plan:      Debridement of callus B/L.  Discussed surgery with patient.  RTC 10 weeks. Dispense padding.   Helane GuntherGregory Khristopher Kapaun DPM

## 2017-03-15 ENCOUNTER — Ambulatory Visit (INDEPENDENT_AMBULATORY_CARE_PROVIDER_SITE_OTHER): Payer: Medicare Other | Admitting: Podiatry

## 2017-03-15 ENCOUNTER — Encounter: Payer: Self-pay | Admitting: Podiatry

## 2017-03-15 DIAGNOSIS — L84 Corns and callosities: Secondary | ICD-10-CM

## 2017-03-15 DIAGNOSIS — M216X9 Other acquired deformities of unspecified foot: Secondary | ICD-10-CM

## 2017-03-15 NOTE — Progress Notes (Signed)
Subjective:     Patient ID: Julia Christensen, female   DOB: May 11, 1946, 71 y.o.   MRN: [id number]  HPI this patient presents the office with chief complaint of painful calluses on the bottom of her third toe both feet she also has severe diffuse callus noted on the bottom of the balls of both feet she has a especially painful callus on the bottom of the right forefoot.   Localized callus areas noted right foot. she presents the office today for an preventive foot care services.   Review of Systems     Objective:   Physical Exam GENERAL APPEARANCE: Alert, conversant. Appropriately groomed. No acute distress.  VASCULAR: Pedal [facility] at  [facility].  Capillary refill time is immediate to all digits,  Normal temperature gradient.  Digital hair growth is present bilateral  NEUROLOGIC: sensation is normal to 5.07 monofilament at 5/5 sites bilateral.  Light touch is intact bilateral, Muscle strength normal.  MUSCULOSKELETAL: acceptable muscle strength, tone and stability bilateral.  Intrinsic muscluature intact bilateral.  Rectus appearance of foot and digits noted bilateral. 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    DERMATOLOGIC: skin color, texture, and turgor are within normal limits.  No preulcerative lesions or ulcers  are seen, no interdigital maceration noted.  No open lesions present.  Digital nails are asymptomatic. No drainage noted. Diffuse callus forefeet B/L.  Porokeratosis right forefoot.     Assessment:     Porokeratosis/  Callus B/L     Plan:      Debridement of callus B/L.  Discussed surgery with patient.  RTC 10 weeks   Helane GuntherGregory Mayer DPM   .

## 2017-05-24 ENCOUNTER — Ambulatory Visit (INDEPENDENT_AMBULATORY_CARE_PROVIDER_SITE_OTHER): Payer: Medicare Other | Admitting: Podiatry

## 2017-05-24 DIAGNOSIS — L84 Corns and callosities: Secondary | ICD-10-CM

## 2017-05-24 DIAGNOSIS — M216X9 Other acquired deformities of unspecified foot: Secondary | ICD-10-CM

## 2017-05-24 NOTE — Progress Notes (Signed)
Subjective:     Patient ID: Michaelene SongLorraine Roblero, female   DOB: 24-Dec-1945, 71 y.o.   MRN: [id number]  HPI this patient presents the office with chief complaint of painful calluses on the bottom of her third toe both feet she also has severe diffuse callus noted on the bottom of the balls of both feet she has a especially painful callus on the bottom of the right forefoot.   Localized callus areas noted right foot. she presents the office today for an preventive foot care services.   Review of Systems     Objective:   Physical Exam GENERAL APPEARANCE: Alert, conversant. Appropriately groomed. No acute distress.  VASCULAR: Pedal pulses palpable at  [facility].  Capillary refill time is immediate to all digits,  Normal temperature gradient.  Digital hair growth is present bilateral  NEUROLOGIC: sensation is normal to 5.07 monofilament at 5/5 sites bilateral.  Light touch is intact bilateral, Muscle strength normal.  MUSCULOSKELETAL: acceptable muscle strength, tone and stability bilateral.  Intrinsic muscluature intact bilateral.  Rectus appearance of foot and digits noted bilateral. 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    DERMATOLOGIC: skin color, texture, and turgor are within normal limits.  No preulcerative lesions or ulcers  are seen, no interdigital maceration noted.  No open lesions present.  Digital nails are asymptomatic. No drainage noted. Diffuse callus forefeet B/L.  Porokeratosis right forefoot.     Assessment:     Porokeratosis/  Callus B/L     Plan:      Debridement of callus B/L.   RTC 10 weeks   Helane GuntherGregory Ludwin Flahive DPM   .

## 2017-06-04 ENCOUNTER — Other Ambulatory Visit: Payer: Medicare Other | Admitting: Orthotics

## 2017-06-04 DIAGNOSIS — M722 Plantar fascial fibromatosis: Secondary | ICD-10-CM

## 2017-06-25 ENCOUNTER — Ambulatory Visit (INDEPENDENT_AMBULATORY_CARE_PROVIDER_SITE_OTHER): Payer: Medicare Other | Admitting: Orthotics

## 2017-06-25 DIAGNOSIS — M216X9 Other acquired deformities of unspecified foot: Secondary | ICD-10-CM

## 2017-06-25 NOTE — Progress Notes (Signed)
Patient came in today to pick up custom made foot orthotics.  The goals were accomplished and the patient reported no dissatisfaction with said orthotics.  Patient was advised of breakin period and how to report any issues.  Patient to pay $275 self pay

## 2017-08-09 ENCOUNTER — Ambulatory Visit: Payer: Medicare Other | Admitting: Podiatry

## 2017-09-18 ENCOUNTER — Encounter: Payer: Self-pay | Admitting: Podiatry

## 2017-09-18 ENCOUNTER — Ambulatory Visit (INDEPENDENT_AMBULATORY_CARE_PROVIDER_SITE_OTHER): Payer: Medicare Other | Admitting: Podiatry

## 2017-09-18 DIAGNOSIS — L84 Corns and callosities: Secondary | ICD-10-CM | POA: Diagnosis not present

## 2017-09-18 NOTE — Progress Notes (Signed)
Subjective:     Patient ID: Julia Christensen, female   DOB: 02-Apr-1946, 71 y.o.   MRN: [id number]  HPI this patient presents the office with chief complaint of painful calluses on the bottom of her third toe both feet she also has severe diffuse callus noted on the bottom of the balls of both feet she has a especially painful callus on the bottom of the right forefoot.   Localized callus areas noted right foot. she presents the office today for an preventive foot care services.   Review of Systems     Objective:   Physical Exam GENERAL APPEARANCE: Alert, conversant. Appropriately groomed. No acute distress.  VASCULAR: Pedal [facility] palpable at  [facility].  Capillary refill time is immediate to all digits,  Normal temperature gradient.  Digital hair growth is present bilateral  NEUROLOGIC: sensation is normal to 5.07 monofilament at 5/5 sites bilateral.  Light touch is intact bilateral, Muscle strength normal.  MUSCULOSKELETAL: acceptable muscle strength, tone and stability bilateral.  Intrinsic muscluature intact bilateral.  Rectus appearance of foot and digits noted bilateral. 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    DERMATOLOGIC: skin color, texture, and turgor are within normal limits.  No preulcerative lesions or ulcers  are seen, no interdigital maceration noted.  No open lesions present.  Digital nails are asymptomatic. No drainage noted. Diffuse callus forefeet B/L.  Porokeratosis right forefoot.     Assessment:     Porokeratosis/  Callus B/L     Plan:      Debridement of callus B/L.   RTC 10 weeks.  ABN signed for 2018.   Julia Christensen DPM   .

## 2017-12-04 ENCOUNTER — Ambulatory Visit (INDEPENDENT_AMBULATORY_CARE_PROVIDER_SITE_OTHER): Payer: Medicare Other | Admitting: Podiatry

## 2017-12-04 ENCOUNTER — Ambulatory Visit: Payer: Medicare Other | Admitting: Orthotics

## 2017-12-04 ENCOUNTER — Encounter: Payer: Self-pay | Admitting: Podiatry

## 2017-12-04 DIAGNOSIS — M216X9 Other acquired deformities of unspecified foot: Secondary | ICD-10-CM

## 2017-12-04 DIAGNOSIS — L84 Corns and callosities: Secondary | ICD-10-CM | POA: Diagnosis not present

## 2017-12-04 NOTE — Progress Notes (Signed)
Patient needed more offloading at 3rd MPJ; I cut out 3mm out of cover and added 1/16 p-cell to offload; she immediately felt relief.

## 2017-12-04 NOTE — Progress Notes (Signed)
Subjective:     Patient ID: Julia Christensen, female   DOB: 03/26/1946, 72 y.o.   MRN: [id number]  HPI this patient presents the office with chief complaint of painful calluses on the bottom of her third toe both feet she also has severe diffuse callus noted on the bottom of the balls of both feet she has a especially painful callus on the bottom of the right forefoot.   Localized callus areas noted right foot. she presents the office today for an preventive foot care services.   Review of Systems     Objective:   Physical Exam GENERAL APPEARANCE: Alert, conversant. Appropriately groomed. No acute distress.  VASCULAR: Pedal pulses [facility]able at  [facility].  Capillary refill time is immediate to all digits,  Normal temperature gradient.  Digital hair growth is present bilateral  NEUROLOGIC: sensation is normal to 5.07 monofilament at 5/5 sites bilateral.  Light touch is intact bilateral, Muscle strength normal.  MUSCULOSKELETAL: acceptable muscle strength, tone and stability bilateral.  Intrinsic muscluature intact bilateral.  Rectus appearance of foot and digits noted bilateral. 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    DERMATOLOGIC: skin color, texture, and turgor are within normal limits.  No preulcerative lesions or ulcers  are seen, no interdigital maceration noted.  No open lesions present.  Digital nails are asymptomatic. No drainage noted. Diffuse callus forefeet B/L.  Porokeratosis right forefoot.     Assessment:     Porokeratosis/  Callus B/L     Plan:      Debridement of callus B/L.   RTC 10 weeks.  ABN signed for 2019.  Patient was seen by Julia Nobleick who added additional dispersion padding.Julia Christensen.   Julia Christensen DPM   .

## 2018-03-05 ENCOUNTER — Ambulatory Visit: Payer: Medicare Other | Admitting: Podiatry
